# Patient Record
Sex: Male | Born: 1965 | Race: Black or African American | Hispanic: No | Marital: Married | Smoking: Never smoker
Health system: Southern US, Community
[De-identification: ages and names within clinical notes are randomized; demographics above are authoritative.]

## PROBLEM LIST (undated history)

## (undated) DIAGNOSIS — D497 Neoplasm of unspecified behavior of endocrine glands and other parts of nervous system: Secondary | ICD-10-CM

## (undated) DIAGNOSIS — R7989 Other specified abnormal findings of blood chemistry: Secondary | ICD-10-CM

---

## 1997-12-15 ENCOUNTER — Ambulatory Visit (HOSPITAL_COMMUNITY): Admission: RE | Admit: 1997-12-15 | Discharge: 1997-12-15 | Payer: Self-pay | Admitting: *Deleted

## 1999-07-19 ENCOUNTER — Emergency Department (HOSPITAL_COMMUNITY): Admission: EM | Admit: 1999-07-19 | Discharge: 1999-07-19 | Payer: Self-pay | Admitting: Emergency Medicine

## 1999-07-19 ENCOUNTER — Encounter: Payer: Self-pay | Admitting: Emergency Medicine

## 2000-02-20 ENCOUNTER — Emergency Department (HOSPITAL_COMMUNITY): Admission: EM | Admit: 2000-02-20 | Discharge: 2000-02-21 | Payer: Self-pay | Admitting: Emergency Medicine

## 2000-02-20 ENCOUNTER — Encounter: Payer: Self-pay | Admitting: Emergency Medicine

## 2011-10-01 ENCOUNTER — Emergency Department (HOSPITAL_COMMUNITY)
Admission: EM | Admit: 2011-10-01 | Discharge: 2011-10-01 | Disposition: A | Discharge status: Home / Self Care | Payer: BC Managed Care – PPO | Attending: Emergency Medicine | Admitting: Emergency Medicine

## 2011-10-01 ENCOUNTER — Encounter (HOSPITAL_COMMUNITY): Payer: Self-pay | Admitting: *Deleted

## 2011-10-01 DIAGNOSIS — J3489 Other specified disorders of nose and nasal sinuses: Secondary | ICD-10-CM | POA: Insufficient documentation

## 2011-10-01 DIAGNOSIS — R0981 Nasal congestion: Secondary | ICD-10-CM

## 2011-10-01 DIAGNOSIS — R51 Headache: Secondary | ICD-10-CM | POA: Insufficient documentation

## 2011-10-01 HISTORY — DX: Neoplasm of unspecified behavior of endocrine glands and other parts of nervous system: D49.7

## 2011-10-01 HISTORY — DX: Other specified abnormal findings of blood chemistry: R79.89

## 2011-10-01 MED ORDER — PREDNISONE 10 MG PO TABS: ORAL_TABLET | ORAL | Status: AC

## 2011-10-01 MED ORDER — SUMATRIPTAN SUCCINATE 6 MG/0.5ML ~~LOC~~ SOLN
6.0000 mg | Freq: Once | SUBCUTANEOUS | Status: AC
  Administered 2011-10-01: 6 mg via SUBCUTANEOUS
  Filled 2011-10-01: qty 0.5

## 2011-10-01 NOTE — ED Notes (Signed)
Pt states that he has been having a HA for the past three weeks off and on, pt states he gets cluster HA, pt states tumor on pitutiray gland. Pt states when he lays down the HA gets worse. Pt states tonight he couldn't lay down at all.

## 2011-10-01 NOTE — ED Provider Notes (Addendum)
History     CSN: [id number]  Arrival date & time 10/01/11  2956   First MD Initiated Contact with Patient 10/01/11 0703      Chief Complaint  Patient presents with  . Headache    (Consider location/radiation/quality/duration/timing/severity/associated sxs/prior treatment) HPIWendell Bray is a 46 y.o. male with a history of pituitary adenoma as well as cluster headaches presents with right-sided stabbing headache. He last had a cluster headache about 2-3 years ago, at that point was seen headache specialist and was taking prophylactic medication. Headaches began 3 weeks ago, he typically began at night when he lays down, they're worse at that point, they come on gradually, they're 10/10 in intensity, there is a sharp stabbing sensation in the right temporal region does extend behind the eye, grossly gets better, currently his headache is 5/10, as responded poorly to over-the-counter medication such as ibuprofen. He said some mild nausea, no vomiting. No chest pain, shortness of breath, numbness tingling, or weakness.  Past Medical History  Diagnosis Date  . Pituitary tumor   . Low testosterone     History reviewed. No pertinent past surgical history.  History reviewed. No pertinent family history.  History  Substance Use Topics  . Smoking status: Never Smoker   . Smokeless tobacco: Not on file  . Alcohol Use: Yes      Review of Systems positive for headaches, nausea; Patient denies any fevers or chills, changes in vision, earache, sore throat, neck pain or stiffness, chest pain or pressure, palpitations, syncope, dyspnea, cough, wheezing,  abdominal pain, vomiting, diarrhea, melena, red bloody stools, frequency, dysuria, myalgias, arthralgias, back pain, recent trauma, rash, itching, skin lesions, easy bruising or bleeding, headache, seizures, numbness, tingling or weakness and denies depression, and anxiety.    Allergies  Review of patient's allergies indicates no known  allergies.  Home Medications   Current Outpatient Rx  Name Route Sig Dispense Refill  . IBUPROFEN 200 MG PO TABS Oral Take 400 mg by mouth every 6 (six) hours as needed. For pain    . TESTOSTERONE 50 MG/5GM TD GEL Transdermal Place 5 g onto the skin daily.      BP 159/86  Pulse 64  Temp 98.3 F (36.8 C) (Oral)  Resp 16  SpO2 98%  Physical Exam PHYSICAL EXAM: VITAL SIGNS:   Filed Vitals:   10/01/11 0622  BP: 159/86  Pulse: 64  Temp: 98.3 F (36.8 C)  Resp: 16   CONSTITUTIONAL: Awake, oriented, appears non-toxic HENT: Atraumatic, normocephalic, oral mucosa pink and moist, airway patent. Nares patent without drainage. External ears normal. EYES: Conjunctiva clear, EOMI, PERRLA NECK: Trachea midline, non-tender, supple CARDIOVASCULAR: Normal heart rate, Normal rhythm, No murmurs, rubs, gallops PULMONARY/CHEST: Clear to auscultation, no rhonchi, wheezes, or rales. Symmetrical breath sounds. CHEST WALL: No lesions. Non-tender. ABDOMINAL: Morbidly obese, non-distended, soft, non-tender - no rebound or guarding.  BS normal. NEUROLOGIC: OZ:HYQMVH fields intact. PERRLA, EOMI.  Facial sensation equal to light touch bilaterally.  Good muscle bulk in the masseter muscle and good lateral movement of the jaw.  Facial expressions equal and good strength with smile/frown and puffed cheeks.  Hearing grossly intact to finger rub test.  Uvula, tongue are midline with no deviation. Symmetrical palate elevation.  Trapezius and SCM muscles are 5/5 strength bilaterally.   DTR: Brachioradialis, biceps, patellar, reflexes 2+ bilaterally.  No clonus. Strength: 5/5 strength flexors and extensors in the upper and lower extremities.  Grip strength, finger adduction/abduction 5/5. Sensation: Sensation intact distally to light touch,  proprioception using position testing of 2nd digit and great toe Cerebellar: No ataxia with walking or dysmetria with finger to nose, rapid alternating hand movements and heels  to shin testing. EXTREMITIES: No clubbing, cyanosis, or edema SKIN: Warm, Dry, No erythema, No rash  ED Course  Procedures (including critical care time)  Labs Reviewed - No data to display No results found.   1. Headache   2. Sinus congestion       MDM  Alexander Bray is a 46 y.o. male history of cluster headaches who presents with headache consistent with cluster headache-patient has gotten some relief with Tylenol oxygen via nonrebreather, given patient a 6 mg subcutaneous dose of sumatriptan and reevaluate. He does have a history of pituitary adenoma and did mention that his primary care physician and said that his prolactin level was increasing, however the patient does have a completely normal neurologic exam at this time I do not think there is any mass effect and with regards to a tumor that would require emergent imaging at this time.  Similarly I do not think that he's got intracranial lead, he is nontoxic and afebrile and has no symptoms consistent with meningitis either.   Patient's headache is completely aborted with high flow oxygen and sumatriptan. We'll discharge the patient home with prednisone taper.  Patient can follow up with his headache specialist to discuss abortive therapies and long-term prophylactic medication. Advised him against using his nasal spray, also believe that the nasal spray was causing vasospasm.   I explained the diagnosis in detail and have given ER return precautions including chest pain, shortness of breath, worsening headache or any other new or worsening symptoms. The patient understands and accepts the medical plan as it's been dictated and I have answered all questions. Discharge instructions concerning home care and prescriptions have been given.  The patient is STABLE and is discharged to home in good condition.            Jones Skene, MD 10/01/11 1610  Jones Skene, MD 10/01/11 9604

## 2014-04-17 ENCOUNTER — Encounter (HOSPITAL_COMMUNITY): Payer: Self-pay | Admitting: *Deleted

## 2014-04-17 ENCOUNTER — Emergency Department (HOSPITAL_COMMUNITY)
Admission: EM | Admit: 2014-04-17 | Discharge: 2014-04-18 | Disposition: A | Discharge status: Home / Self Care | Payer: BLUE CROSS/BLUE SHIELD | Attending: Emergency Medicine | Admitting: Emergency Medicine

## 2014-04-17 DIAGNOSIS — R1084 Generalized abdominal pain: Secondary | ICD-10-CM | POA: Insufficient documentation

## 2014-04-17 DIAGNOSIS — Z79899 Other long term (current) drug therapy: Secondary | ICD-10-CM | POA: Diagnosis not present

## 2014-04-17 NOTE — ED Notes (Signed)
The pt uis c/o abd pain since 1400 today no nv or diarrhea

## 2014-04-17 NOTE — ED Provider Notes (Signed)
CSN: [id number]     Arrival date & time 04/17/14  2324 History  This chart was scribed for Linton Flemings, MD by Rayfield Citizen, ED Scribe. This patient was seen in room A07C/A07C and the patient's care was started at 12:05 AM.    Chief Complaint  Patient presents with  . Abdominal Pain   Patient is a 49 y.o. male presenting with abdominal pain. The history is provided by the patient. No language interpreter was used.  Abdominal Pain Associated symptoms: no constipation, no diarrhea, no fever, no nausea and no vomiting      HPI Comments: Alexander Bray is an otherwise healthy 49 y.o. male who presents to the Emergency Department complaining of upper abdominal pain beginning today at 14:00, described as constant in the epigastric region and intermittent in the LUQ. Patient states his symptoms initially felt similar to prior experience with gas but persisted, which concerned him enough to come to the ED tonight. No treatments or medications tried PTA. He notes recent diet change due to scheduling; more late night eating, etc. He denies difficulty with urination or bowel movements, denies fever, nausea, vomiting.    He states his prior colonoscopy returned all normal. He is an occasional EtOH user; uses tobacco in the form of cigars and pipes.   Past Medical History  Diagnosis Date  . Pituitary tumor   . Low testosterone    History reviewed. No pertinent past surgical history. No family history on file. History  Substance Use Topics  . Smoking status: Never Smoker   . Smokeless tobacco: Not on file  . Alcohol Use: Yes    Review of Systems  Constitutional: Negative for fever.  Gastrointestinal: Positive for abdominal pain. Negative for nausea, vomiting, diarrhea and constipation.  Genitourinary: Negative for difficulty urinating.  All other systems reviewed and are negative.  Allergies  Review of patient's allergies indicates no known allergies.  Home Medications   Prior to Admission  medications   Medication Sig Start Date End Date Taking? Authorizing Provider  ibuprofen (ADVIL,MOTRIN) 200 MG tablet Take 400 mg by mouth every 6 (six) hours as needed. For pain    Historical Provider, MD  predniSONE (DELTASONE) 10 MG tablet Take 60 mg daily for 7 days then decrease the dosage by 10 mg (1 pill) daily until the medicine is gone. 10/01/11   John-Adam Bonk, MD  testosterone (ANDROGEL) 50 MG/5GM GEL Place 5 g onto the skin daily.    Historical Provider, MD   BP 150/90 mmHg  Pulse 82  Temp(Src) 98.4 F (36.9 C) (Oral)  Resp 24  Ht 6' (1.829 m)  Wt 382 lb (173.274 kg)  BMI 51.80 kg/m2  SpO2 98% Physical Exam  Constitutional: He is oriented to person, place, and time. He appears well-developed and well-nourished. No distress.  Morbidly obese  HENT:  Head: Normocephalic and atraumatic.  Mouth/Throat: Oropharynx is clear and moist. No oropharyngeal exudate.  Moist mucous membranes  Eyes: EOM are normal. Pupils are equal, round, and reactive to light.  Neck: Normal range of motion. Neck supple. No JVD present.  Cardiovascular: Normal rate, regular rhythm and normal heart sounds.  Exam reveals no gallop and no friction rub.   No murmur heard. Pulmonary/Chest: Effort normal and breath sounds normal. No respiratory distress. He has no wheezes. He has no rales.  Abdominal: Soft. Bowel sounds are normal. He exhibits no mass. There is tenderness (Pain with palpation to LUQ and bilateral lower quadrants; pain with movement and laughter). There is  no rebound and no guarding.  Musculoskeletal: Normal range of motion. He exhibits no edema.  Moves all extremities normally.   Lymphadenopathy:    He has no cervical adenopathy.  Neurological: He is alert and oriented to person, place, and time. He displays normal reflexes.  Skin: Skin is warm and dry. No rash noted.  Psychiatric: He has a normal mood and affect. His behavior is normal.  Nursing note and vitals reviewed.   ED Course   Procedures   DIAGNOSTIC STUDIES: Oxygen Saturation is 98% on RA, normal by my interpretation.    COORDINATION OF CARE: 12:11 AM Discussed treatment plan with pt at bedside and pt agreed to plan.   Labs Review Labs Reviewed  COMPREHENSIVE METABOLIC PANEL - Abnormal; Notable for the following:    Potassium 3.4 (*)    Glucose, Bld 103 (*)    All other components within normal limits  URINALYSIS, ROUTINE W REFLEX MICROSCOPIC - Abnormal; Notable for the following:    Ketones, ur 15 (*)    All other components within normal limits  LIPASE, BLOOD  CBC WITH DIFFERENTIAL/PLATELET    Imaging Review Ct Abdomen Pelvis W Contrast  04/18/2014   CLINICAL DATA:  Left upper quadrant pain  EXAM: CT ABDOMEN AND PELVIS WITH CONTRAST  TECHNIQUE: Multidetector CT imaging of the abdomen and pelvis was performed using the standard protocol following bolus administration of intravenous contrast.  CONTRAST:  122mL OMNIPAQUE IOHEXOL 300 MG/ML  SOLN  COMPARISON:  None.  FINDINGS: There are normal appearances of the liver, gallbladder and bile ducts.  Pancreas is normal.  Spleen is normal.  Adrenals and kidneys are normal. Ureters are unremarkable. Urinary bladder is unremarkable.  Stomach, small bowel and colon are normal.  Abdominal aorta is normal in caliber with minimal atherosclerotic calcification.  No inflammatory changes are evident in the abdomen or pelvis. There is no adenopathy. There is no ascites. No acute musculoskeletal abnormalities are evident.  IMPRESSION: No significant abnormality   Electronically Signed   By: Andreas Newport M.D.   On: 04/18/2014 02:29     EKG Interpretation None      MDM   Final diagnoses:  Generalized abdominal pain    49 year old male with ethanol pain since 2:00 today.  Patient is moderately tender on physical exam, concern for diverticulitis.  Plan for CT abdomen pelvis and labs.  Pain control.  I personally performed the services described in this  documentation, which was scribed in my presence. The recorded information has been reviewed and is accurate.     2:50 AM CT scan without acute findings.  Labs are normal.  Patient with some increase in pain.  Will try GI cocktail to see if that will help ease symptoms.  Urine also negative.  Will treat with Toradol, discharged home with until and Vicodin for pain.  Linton Flemings, MD 04/18/14 [phone]

## 2014-04-18 ENCOUNTER — Emergency Department (HOSPITAL_COMMUNITY): Payer: BLUE CROSS/BLUE SHIELD

## 2014-04-18 ENCOUNTER — Encounter (HOSPITAL_COMMUNITY): Payer: Self-pay

## 2014-04-18 LAB — COMPREHENSIVE METABOLIC PANEL
ALT: 20 U/L (ref 0–53)
ANION GAP: 6 (ref 5–15)
AST: 22 U/L (ref 0–37)
Albumin: 3.5 g/dL (ref 3.5–5.2)
Alkaline Phosphatase: 88 U/L (ref 39–117)
BILIRUBIN TOTAL: 0.9 mg/dL (ref 0.3–1.2)
BUN: 10 mg/dL (ref 6–23)
CO2: 24 mmol/L (ref 19–32)
Calcium: 8.4 mg/dL (ref 8.4–10.5)
Chloride: 109 mmol/L (ref 96–112)
Creatinine, Ser: 0.8 mg/dL (ref 0.50–1.35)
GFR calc Af Amer: >90 mL/min (ref >90)
GFR calc non Af Amer: >90 mL/min (ref >90)
GLUCOSE: 103 mg/dL — AB (ref 70–99)
POTASSIUM: 3.4 mmol/L — AB (ref 3.5–5.1)
SODIUM: 139 mmol/L (ref 135–145)
TOTAL PROTEIN: 7.5 g/dL (ref 6.0–8.3)

## 2014-04-18 LAB — URINALYSIS, ROUTINE W REFLEX MICROSCOPIC
BILIRUBIN URINE: NEGATIVE
Glucose, UA: NEGATIVE mg/dL
HGB URINE DIPSTICK: NEGATIVE
Ketones, ur: 15 mg/dL — AB
LEUKOCYTES UA: NEGATIVE
Nitrite: NEGATIVE
PROTEIN: NEGATIVE mg/dL
SPECIFIC GRAVITY, URINE: 1.02 (ref 1.005–1.030)
Urobilinogen, UA: 0.2 mg/dL (ref 0.0–1.0)
pH: 5 (ref 5.0–8.0)

## 2014-04-18 LAB — CBC WITH DIFFERENTIAL/PLATELET
Basophils Absolute: 0 10*3/uL (ref 0.0–0.1)
Basophils Relative: 0 % (ref 0–1)
EOS ABS: 0.1 10*3/uL (ref 0.0–0.7)
Eosinophils Relative: 1 % (ref 0–5)
HEMATOCRIT: 40.6 % (ref 39.0–52.0)
Hemoglobin: 13.3 g/dL (ref 13.0–17.0)
LYMPHS ABS: 1.5 10*3/uL (ref 0.7–4.0)
Lymphocytes Relative: 19 % (ref 12–46)
MCH: 27.5 pg (ref 26.0–34.0)
MCHC: 32.8 g/dL (ref 30.0–36.0)
MCV: 84.1 fL (ref 78.0–100.0)
MONO ABS: 0.4 10*3/uL (ref 0.1–1.0)
Monocytes Relative: 5 % (ref 3–12)
Neutro Abs: 5.8 10*3/uL (ref 1.7–7.7)
Neutrophils Relative %: 75 % (ref 43–77)
PLATELETS: 241 10*3/uL (ref 150–400)
RBC: 4.83 MIL/uL (ref 4.22–5.81)
RDW: 13.3 % (ref 11.5–15.5)
WBC: 7.7 10*3/uL (ref 4.0–10.5)

## 2014-04-18 LAB — LIPASE, BLOOD: Lipase: 24 U/L (ref 11–59)

## 2014-04-18 MED ORDER — HYDROMORPHONE HCL 1 MG/ML IJ SOLN
INTRAMUSCULAR | Status: AC
  Filled 2014-04-18: qty 1

## 2014-04-18 MED ORDER — DICYCLOMINE HCL 20 MG PO TABS: 20.0000 mg | ORAL_TABLET | Freq: Four times a day (QID) | ORAL | Status: AC | PRN

## 2014-04-18 MED ORDER — ONDANSETRON 8 MG PO TBDP: 8.0000 mg | ORAL_TABLET | Freq: Three times a day (TID) | ORAL | Status: AC | PRN

## 2014-04-18 MED ORDER — HYDROCODONE-ACETAMINOPHEN 5-325 MG PO TABS: 2.0000 | ORAL_TABLET | ORAL | Status: AC | PRN

## 2014-04-18 MED ORDER — SODIUM CHLORIDE 0.9 % IV BOLUS (SEPSIS)
1000.0000 mL | Freq: Once | INTRAVENOUS | Status: AC
  Administered 2014-04-18: 1000 mL via INTRAVENOUS

## 2014-04-18 MED ORDER — IOHEXOL 300 MG/ML  SOLN
25.0000 mL | Freq: Once | INTRAMUSCULAR | Status: AC | PRN
  Administered 2014-04-18: 25 mL via ORAL

## 2014-04-18 MED ORDER — KETOROLAC TROMETHAMINE 30 MG/ML IJ SOLN
30.0000 mg | Freq: Once | INTRAMUSCULAR | Status: AC
  Administered 2014-04-18: 30 mg via INTRAVENOUS
  Filled 2014-04-18: qty 1

## 2014-04-18 MED ORDER — HYDROMORPHONE HCL 1 MG/ML IJ SOLN
1.0000 mg | Freq: Once | INTRAMUSCULAR | Status: AC
  Administered 2014-04-18: 1 mg via INTRAVENOUS

## 2014-04-18 MED ORDER — IOHEXOL 300 MG/ML  SOLN
100.0000 mL | Freq: Once | INTRAMUSCULAR | Status: AC | PRN
  Administered 2014-04-18: 100 mL via INTRAVENOUS

## 2014-04-18 MED ORDER — DICYCLOMINE HCL 10 MG/ML IM SOLN
20.0000 mg | Freq: Once | INTRAMUSCULAR | Status: AC
  Administered 2014-04-18: 20 mg via INTRAMUSCULAR
  Filled 2014-04-18: qty 2

## 2014-04-18 MED ORDER — ONDANSETRON HCL 4 MG/2ML IJ SOLN
4.0000 mg | Freq: Once | INTRAMUSCULAR | Status: AC
  Administered 2014-04-18: 4 mg via INTRAVENOUS
  Filled 2014-04-18: qty 2

## 2014-04-18 MED ORDER — GI COCKTAIL ~~LOC~~
30.0000 mL | Freq: Once | ORAL | Status: AC
  Administered 2014-04-18: 30 mL via ORAL
  Filled 2014-04-18: qty 30

## 2014-04-18 MED ORDER — MORPHINE SULFATE 4 MG/ML IJ SOLN
4.0000 mg | Freq: Once | INTRAMUSCULAR | Status: AC
  Administered 2014-04-18: 4 mg via INTRAVENOUS
  Filled 2014-04-18: qty 1

## 2014-04-18 NOTE — ED Notes (Signed)
Offered patient urinal. Patient said he did not have the urge to urinate. Patient refused in and out and said he would give sample when he could.

## 2014-04-18 NOTE — Discharge Instructions (Signed)
Here workup today did not show any signs of infection, inflammation, blockage, tumors or other concerning findings.  Take medication as prescribed.  Stick to a bland diet.  Follow-up with your primary care doctor for recheck in 3-5 days.  Return to the emergency department for worsening condition or new concerning symptoms.    Abdominal Pain Many things can cause abdominal pain. Usually, abdominal pain is not caused by a disease and will improve without treatment. It can often be observed and treated at home. Your health care provider will do a physical exam and possibly order blood tests and X-rays to help determine the seriousness of your pain. However, in many cases, more time must pass before a clear cause of the pain can be found. Before that point, your health care provider may not know if you need more testing or further treatment. HOME CARE INSTRUCTIONS  Monitor your abdominal pain for any changes. The following actions may help to alleviate any discomfort you are experiencing:  Only take over-the-counter or prescription medicines as directed by your health care provider.  Do not take laxatives unless directed to do so by your health care provider.  Try a clear liquid diet (broth, tea, or water) as directed by your health care provider. Slowly move to a bland diet as tolerated. SEEK MEDICAL CARE IF:  You have unexplained abdominal pain.  You have abdominal pain associated with nausea or diarrhea.  You have pain when you urinate or have a bowel movement.  You experience abdominal pain that wakes you in the night.  You have abdominal pain that is worsened or improved by eating food.  You have abdominal pain that is worsened with eating fatty foods.  You have a fever. SEEK IMMEDIATE MEDICAL CARE IF:   Your pain does not go away within 2 hours.  You keep throwing up (vomiting).  Your pain is felt only in portions of the abdomen, such as the right side or the left lower portion  of the abdomen.  You pass bloody or black tarry stools. MAKE SURE YOU:  Understand these instructions.   Will watch your condition.   Will get help right away if you are not doing well or get worse.  Document Released: 10/25/2004 Document Revised: 01/20/2013 Document Reviewed: 09/24/2012 [facility] Patient Information 2015 [place], [place]. This information is not intended to replace advice given to you by your health care provider. Make sure you discuss any questions you have with your health care provider.  Pain of Unknown Etiology (Pain Without a Known Cause) You have come to your caregiver because of pain. Pain can occur in any part of the body. Often there is not a definite cause. If your laboratory (blood or urine) work was normal and X-rays or other studies were normal, your caregiver may treat you without knowing the cause of the pain. An example of this is the headache. Most headaches are diagnosed by taking a history. This means your caregiver asks you questions about your headaches. Your caregiver determines a treatment based on your answers. Usually testing done for headaches is normal. Often testing is not done unless there is no response to medications. Regardless of where your pain is located today, you can be given medications to make you comfortable. If no physical cause of pain can be found, most cases of pain will gradually leave as suddenly as they came.  If you have a painful condition and no reason can be found for the pain, it is important that  you follow up with your caregiver. If the pain becomes worse or does not go away, it may be necessary to repeat tests and look further for a possible cause.  Only take over-the-counter or prescription medicines for pain, discomfort, or fever as directed by your caregiver.  For the protection of your privacy, test results cannot be given over the phone. Make sure you receive the results of your test. Ask how these results are to be  obtained if you have not been informed. It is your responsibility to obtain your test results.  You may continue all activities unless the activities cause more pain. When the pain lessens, it is important to gradually resume normal activities. Resume activities by beginning slowly and gradually increasing the intensity and duration of the activities or exercise. During periods of severe pain, bed rest may be helpful. Lie or sit in any position that is comfortable.  Ice used for acute (sudden) conditions may be effective. Use a large plastic bag filled with ice and wrapped in a towel. This may provide pain relief.  See your caregiver for continued problems. Your caregiver can help or refer you for exercises or physical therapy if necessary. If you were given medications for your condition, do not drive, operate machinery or power tools, or sign legal documents for 24 hours. Do not drink alcohol, take sleeping pills, or take other medications that may interfere with treatment. See your caregiver immediately if you have pain that is becoming worse and not relieved by medications. Document Released: 10/10/2000 Document Revised: 11/05/2012 Document Reviewed: 01/15/2005 [facility] Patient Information 2015 [place], [place]. This information is not intended to replace advice given to you by your health care provider. Make sure you discuss any questions you have with your health care provider.

## 2014-04-18 NOTE — ED Notes (Signed)
CT notified that patient completed his oral contrast.

## 2016-01-10 IMAGING — CT CT ABD-PELV W/ CM
2 of 5 series · 15 of 46 positions shown, 17 images · IV contrast (omnipaque)
Comparison: None.

CLINICAL DATA: Left upper quadrant pain

EXAM:
CT ABDOMEN AND PELVIS WITH CONTRAST
TECHNIQUE: Multidetector CT imaging of the abdomen and pelvis was performed
using the standard protocol following bolus administration of
intravenous contrast.
CONTRAST:  100mL OMNIPAQUE IOHEXOL 300 MG/ML  SOLN

[Series 201: routine, idose (2) · axial · 0.97mm/px · z∈[-526,-76]mm · 12 of 102 slices shown, 14 images]
[im 6/102  soft-tissue]
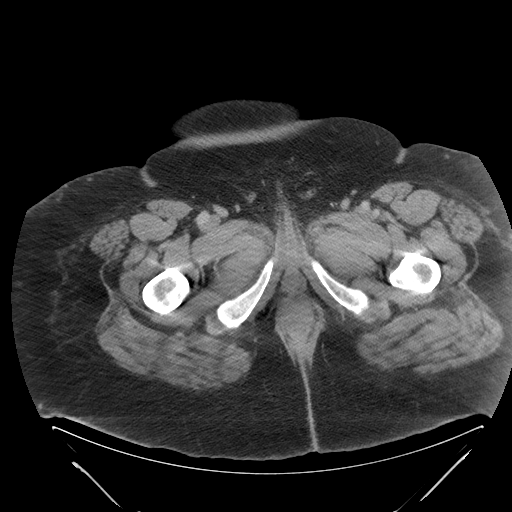
[im 6/102  bone]
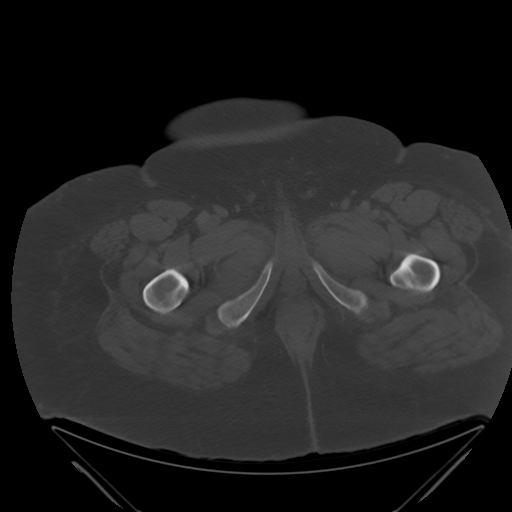
[im 18/102  soft-tissue]
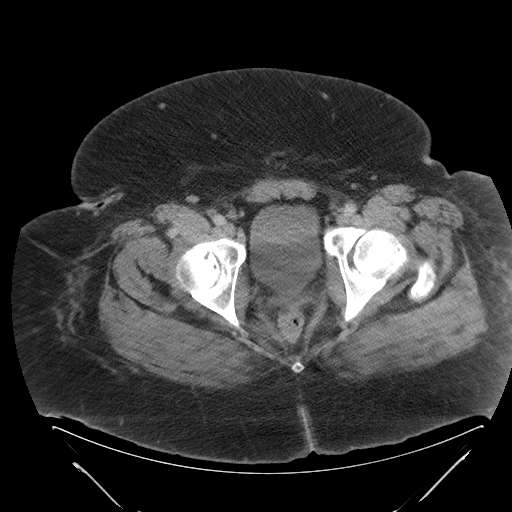
[im 24/102  soft-tissue]
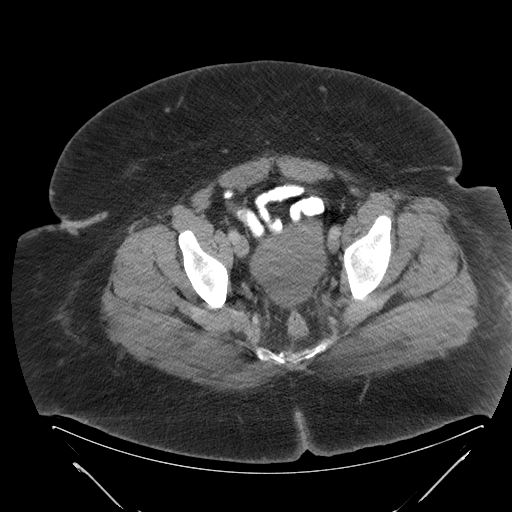
[im 30/102  soft-tissue]
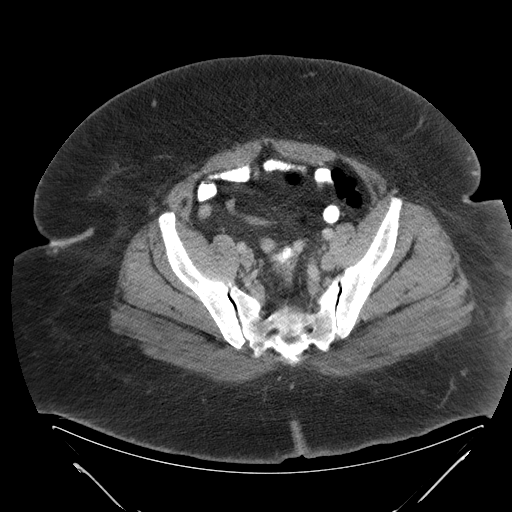
[im 42/102  soft-tissue]
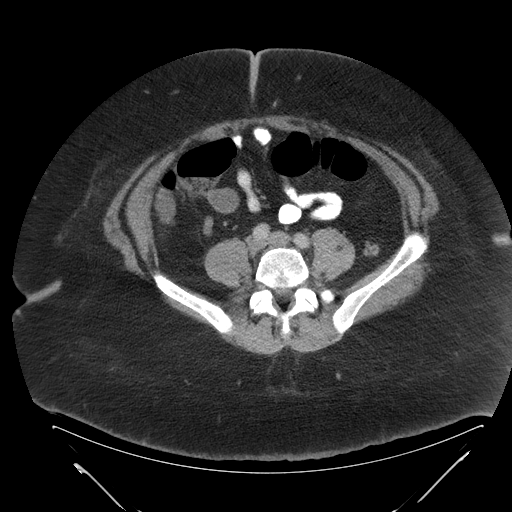
[im 48/102  soft-tissue]
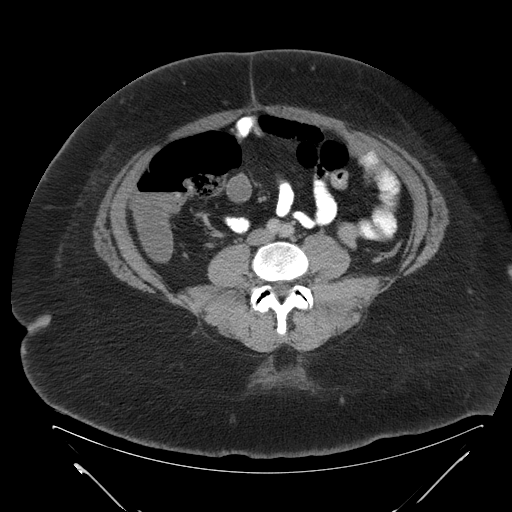
[im 54/102  soft-tissue]
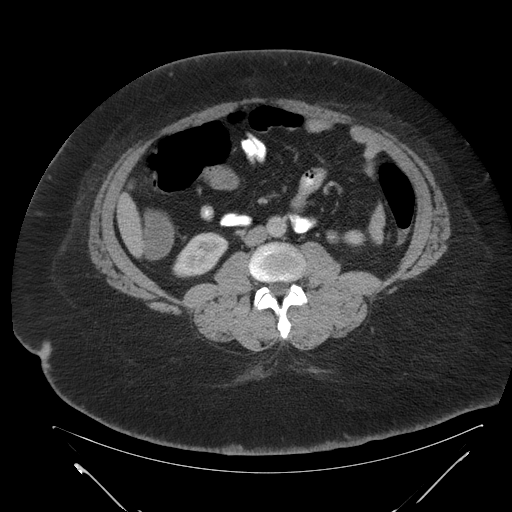
[im 66/102  soft-tissue]
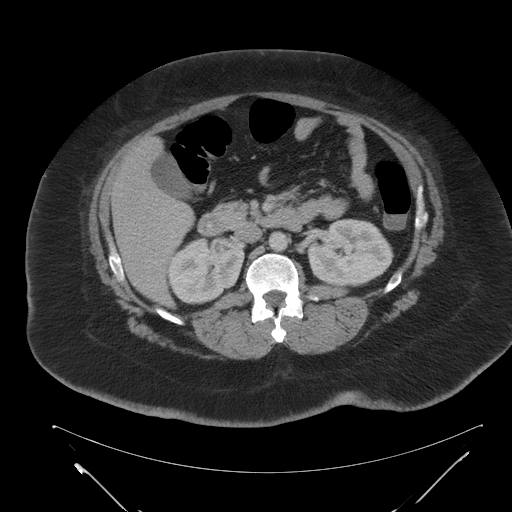
[im 72/102  soft-tissue]
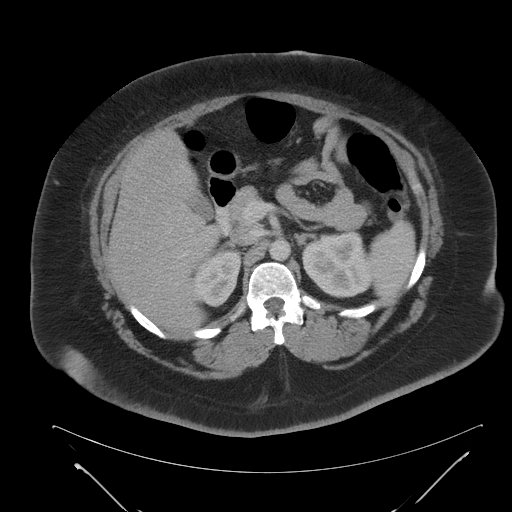
[im 72/102  bone]
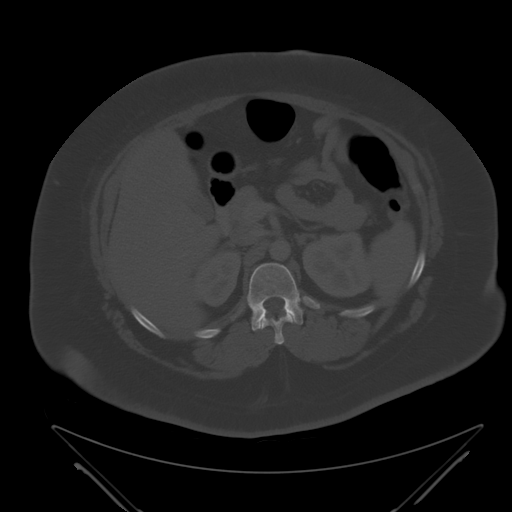
[im 78/102  soft-tissue]
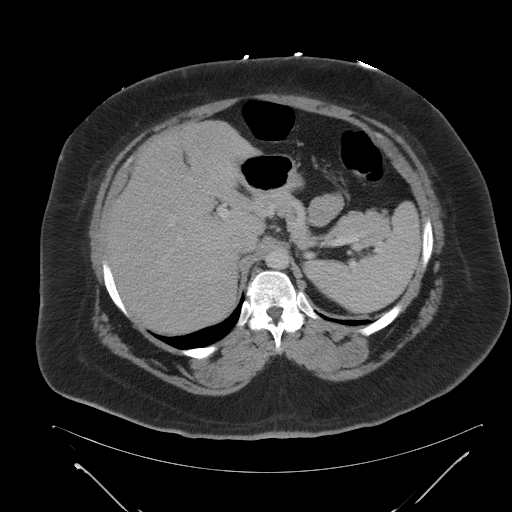
[im 90/102  soft-tissue]
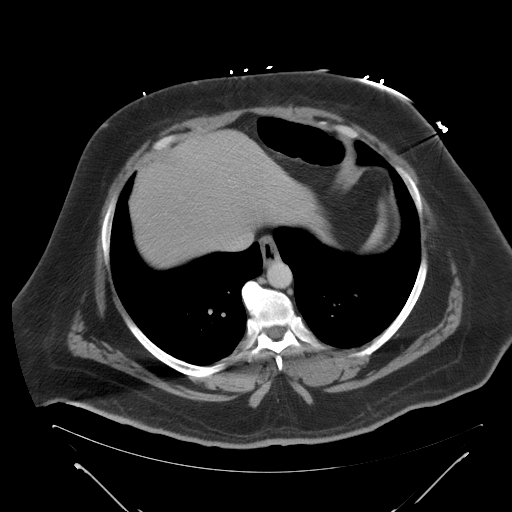
[im 96/102  soft-tissue]
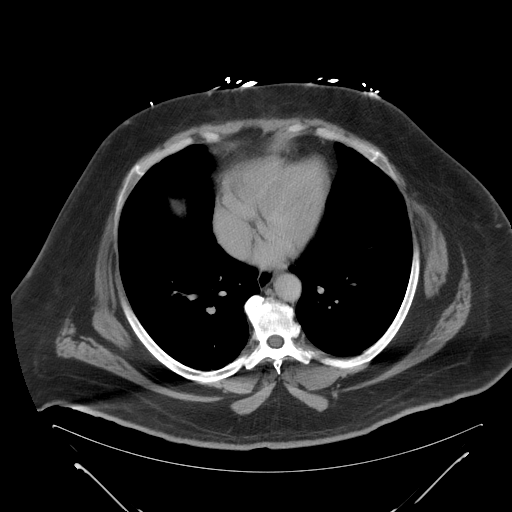

[Series 202: coronals, idose (2) · coronal · 0.70mm/px · 3 of 149 slices shown]
[im 50/149  soft-tissue]
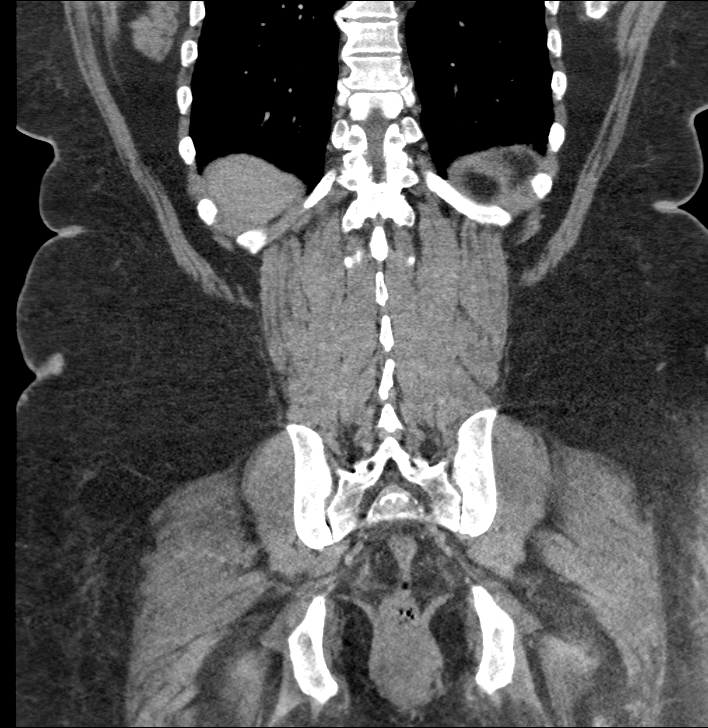
[im 66/149  soft-tissue]
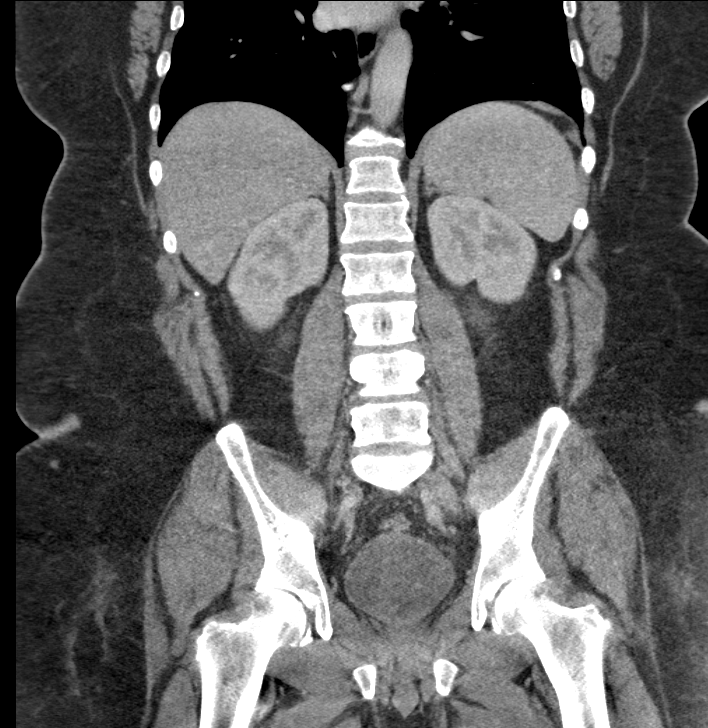
[im 83/149  soft-tissue]
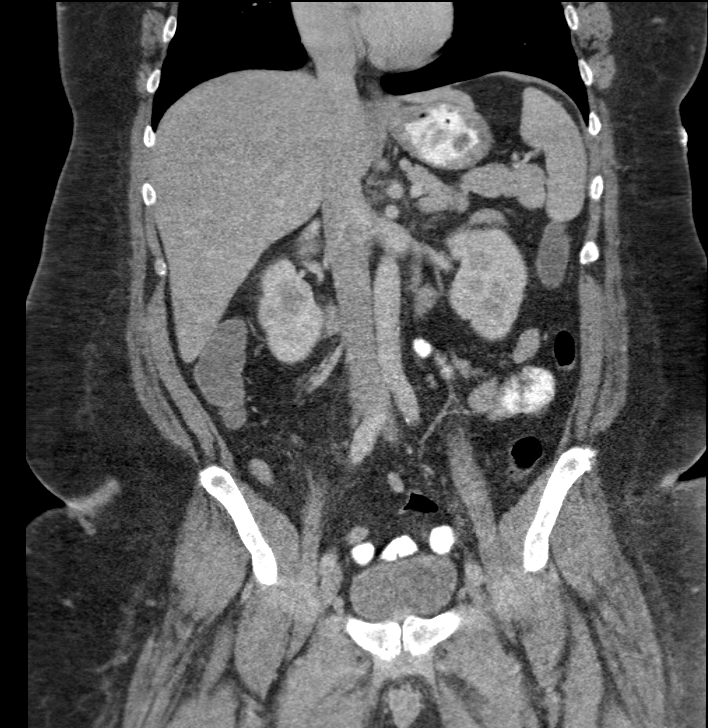

[15 of 46 positions shown; findings below may reference images not displayed]

FINDINGS: There are normal appearances of the liver, gallbladder and bile
ducts.

Pancreas is normal.

Spleen is normal.

Adrenals and kidneys are normal. Ureters are unremarkable. Urinary
bladder is unremarkable.

Stomach, small bowel and colon are normal.

Abdominal aorta is normal in caliber with minimal atherosclerotic
calcification.

No inflammatory changes are evident in the abdomen or pelvis. There
is no adenopathy. There is no ascites. No acute musculoskeletal
abnormalities are evident.
IMPRESSION: No significant abnormality
